# Patient Record
Sex: Female | Born: 1960
Health system: Southern US, Community
[De-identification: ages and names within clinical notes are randomized; demographics above are authoritative.]

---

## 2019-04-14 DIAGNOSIS — K648 Other hemorrhoids: Secondary | ICD-10-CM | POA: Diagnosis not present

## 2019-08-13 ENCOUNTER — Other Ambulatory Visit (HOSPITAL_COMMUNITY)
Admission: RE | Admit: 2019-08-13 | Discharge: 2019-08-13 | Disposition: A | Discharge status: Home / Self Care | Payer: Commercial Managed Care - PPO | Source: Ambulatory Visit | Attending: Nurse Practitioner | Admitting: Nurse Practitioner

## 2019-08-13 ENCOUNTER — Other Ambulatory Visit: Payer: Self-pay | Admitting: Obstetrics and Gynecology

## 2019-08-13 DIAGNOSIS — Z124 Encounter for screening for malignant neoplasm of cervix: Secondary | ICD-10-CM | POA: Diagnosis not present

## 2019-08-14 ENCOUNTER — Other Ambulatory Visit: Payer: Self-pay | Admitting: Nurse Practitioner

## 2019-08-14 DIAGNOSIS — M8580 Other specified disorders of bone density and structure, unspecified site: Secondary | ICD-10-CM

## 2019-08-14 LAB — CYTOLOGY - PAP
Diagnosis: NEGATIVE
HPV: NOT DETECTED

## 2020-02-25 ENCOUNTER — Other Ambulatory Visit: Payer: Self-pay | Admitting: Nurse Practitioner

## 2020-02-25 DIAGNOSIS — Z1231 Encounter for screening mammogram for malignant neoplasm of breast: Secondary | ICD-10-CM

## 2020-03-15 ENCOUNTER — Ambulatory Visit
Admission: RE | Admit: 2020-03-15 | Discharge: 2020-03-15 | Disposition: A | Discharge status: Home / Self Care | Payer: Commercial Managed Care - PPO | Source: Ambulatory Visit | Attending: Nurse Practitioner | Admitting: Nurse Practitioner

## 2020-03-15 ENCOUNTER — Other Ambulatory Visit: Payer: Self-pay

## 2020-03-15 DIAGNOSIS — Z1231 Encounter for screening mammogram for malignant neoplasm of breast: Secondary | ICD-10-CM

## 2020-05-02 ENCOUNTER — Other Ambulatory Visit: Payer: Self-pay | Admitting: Nurse Practitioner

## 2020-05-02 DIAGNOSIS — E2839 Other primary ovarian failure: Secondary | ICD-10-CM

## 2020-05-23 ENCOUNTER — Ambulatory Visit
Admission: RE | Admit: 2020-05-23 | Discharge: 2020-05-23 | Disposition: A | Discharge status: Home / Self Care | Payer: Commercial Managed Care - PPO | Source: Ambulatory Visit | Attending: Nurse Practitioner | Admitting: Nurse Practitioner

## 2020-05-23 ENCOUNTER — Other Ambulatory Visit: Payer: Self-pay

## 2020-05-23 DIAGNOSIS — E2839 Other primary ovarian failure: Secondary | ICD-10-CM

## 2020-07-07 ENCOUNTER — Ambulatory Visit: Payer: Commercial Managed Care - PPO | Admitting: Neurology

## 2020-08-16 ENCOUNTER — Telehealth: Payer: Self-pay | Admitting: Neurology

## 2020-08-16 NOTE — Telephone Encounter (Signed)
Called pt to reschedule 09/08 appointment due to MD having flight issues and being out of office. Pt became upset that she was being rescheduled and asked for a call back from the nurse to discuss. Assured pt that we would get her back in as soon as possible and that her appointment was being cancelled due to issues beyond our control. Pt is still requested call back at [phone].

## 2020-08-16 NOTE — Telephone Encounter (Signed)
Dr Pearlean Brownie now has an opening tomorrow AM at 11:30. Can you please call her to see if she can come then?

## 2020-08-17 ENCOUNTER — Ambulatory Visit (INDEPENDENT_AMBULATORY_CARE_PROVIDER_SITE_OTHER): Payer: Commercial Managed Care - PPO | Admitting: Neurology

## 2020-08-17 ENCOUNTER — Ambulatory Visit: Payer: Commercial Managed Care - PPO | Admitting: Neurology

## 2020-08-17 ENCOUNTER — Encounter: Payer: Self-pay | Admitting: Neurology

## 2020-08-17 ENCOUNTER — Telehealth: Payer: Self-pay | Admitting: Neurology

## 2020-08-17 VITALS — BP 124/83 | HR 77 | Ht 62.0 in | Wt 150.0 lb

## 2020-08-17 DIAGNOSIS — R202 Paresthesia of skin: Secondary | ICD-10-CM | POA: Diagnosis not present

## 2020-08-17 NOTE — Progress Notes (Signed)
[facility] [address] [place]. [place] [address] [phone]       OFFICE CONSULT NOTE  Ms. Bethany Burns Date of Birth:  01/26/1961 Medical Record Number:  [id number]   Referring MD: Jarrett Soho, PA-C  Reason for Referral: Numbness HPI: Bethany Burns is a pleasant 59 year old Hispanic lady with past medical history of osteopenia, hemorrhoids and menopause.  She states she has had 5-year history of progressive paresthesias.  This began mostly in lower extremities and was intermittent involving her thighs on occasional days when she woke up.  These were transient and not bothersome.  However the last year or so paresthesias seem to have progressed.  They are now also involve the left hand mostly and sometimes left cheek as well.  Occasionally the right hand.  These tend to stay longer.  They are still not bothering and she is not taking any medicines for them.  However they are occurring with increased frequency and seem to be staying longer.  She denies any weakness, trouble walking, headache, vertigo, double vision.  She denies excessive fatigue or tiredness and trouble controlling her bowel or bladder.  She has no prior history of neurological problems.  She has not had any specific lab work or imaging studies done for these.  She does have borderline diabetes which is diet controlled.  Recent lab work on 06/09/2020 showed cholesterol to be fine.  She denies any significant neck pain radicular pain, back pain or pain shooting down the legs.  She does admit to increasing stress in the last year or 2 secondary to Covid pandemic denies any major changes in her life her family situation and job situation.  ROS:   14 system review of systems is positive for numbness, tingling, imbalance and all other systems negative  PMH: No past medical history on file.  Social History:  Social History   Socioeconomic History  . Marital status: Unknown    Spouse name: Not on file    . Number of children: Not on file  . Years of education: Not on file  . Highest education level: Not on file  Occupational History  . Not on file  Tobacco Use  . Smoking status: Never Smoker  . Smokeless tobacco: Never Used  Substance and Sexual Activity  . Alcohol use: Not on file  . Drug use: Not on file  . Sexual activity: Not on file  Other Topics Concern  . Not on file  Social History Narrative  . Not on file   Social Determinants of Health   Financial Resource Strain:   . Difficulty of Paying Living Expenses: Not on file  Food Insecurity:   . Worried About Programme researcher, broadcasting/film/video in the Last Year: Not on file  . Ran Out of Food in the Last Year: Not on file  Transportation Needs:   . Lack of Transportation (Medical): Not on file  . Lack of Transportation (Non-Medical): Not on file  Physical Activity:   . Days of Exercise per Week: Not on file  . Minutes of Exercise per Session: Not on file  Stress:   . Feeling of Stress : Not on file  Social Connections:   . Frequency of Communication with Friends and Family: Not on file  . Frequency of Social Gatherings with Friends and Family: Not on file  . Attends Religious Services: Not on file  . Active Member of Clubs or Organizations: Not on file  . Attends Banker Meetings: Not on  file  . Marital Status: Not on file  Intimate Partner Violence:   . Fear of Current or Ex-Partner: Not on file  . Emotionally Abused: Not on file  . Physically Abused: Not on file  . Sexually Abused: Not on file    Medications:   Current Outpatient Medications on File Prior to Visit  Medication Sig Dispense Refill  . Cyanocobalamin (VITAMIN B 12) 100 MCG LOZG Take by mouth.    . Multiple Vitamin (MULTIVITAMIN WITH MINERALS) TABS tablet Take 1 tablet by mouth daily.     No current facility-administered medications on file prior to visit.    Allergies:   Allergies  Allergen Reactions  . Latex Itching    Physical  Exam General: well developed, well nourished middle-aged Hispanic lady, seated, in no evident distress Head: head normocephalic and atraumatic.   Neck: supple with no carotid or supraclavicular bruits Cardiovascular: regular rate and rhythm, no murmurs Musculoskeletal: no deformity Skin:  no rash/petichiae Vascular:  Normal pulses all extremities  Neurologic Exam Mental Status: Awake and fully alert. Oriented to place and time. Recent and remote memory intact. Attention span, concentration and fund of knowledge appropriate. Mood and affect appropriate.  Cranial Nerves: Fundoscopic exam reveals sharp disc margins. Pupils equal, briskly reactive to light. Extraocular movements full without nystagmus. Visual fields full to confrontation. Hearing intact. Facial sensation intact. Face, tongue, palate moves normally and symmetrically.  Motor: Normal bulk and tone. Normal strength in all tested extremity muscles. Sensory.: intact to touch , pinprick , position and vibratory sensation except slight diminished vibration over toes bilaterally only..  Coordination: Rapid alternating movements normal in all extremities. Finger-to-nose and heel-to-shin performed accurately bilaterally. Gait and Station: Arises from chair without difficulty. Stance is normal. Gait demonstrates normal stride length and balance . Able to heel, toe and tandem walk with slight difficulty.  Reflexes: 1+ and symmetric. Toes downgoing.       ASSESSMENT: 58 year old lady with longstanding history of progressive fleeting paresthesias involving all 4 extremities and face of unclear etiology.  Possibilities include demyelinating, inflammatory or vasculitic conditions.  Underlying anxiety may also be contributing     PLAN: I had a long discussion with the patient regarding her complaints of paresthesias which appear to be progressing and out of unclear etiology.  I recommend further evaluation by checking lab work for inflammatory  and demyelinating conditions as well as checking MRI scan of the brain and cervical spine.  The paresthesias do not seem to be disabling at the present time and she is feels she does not need specific medications for the same.  I also encouraged her to increase participation in regular activities for stress relaxation like exercise, swimming, meditation and yoga.  She will call me if her symptoms get worse.  Greater than 50% time during this 45-minute consultation was it was spent on counseling and coordination of care about her paresthesias and discussion about evaluation and treatment and answering questions.  She will return for follow-up in 2 months or call earlier if necessary. Delia Heady, MD Note: This document was prepared with digital dictation and possible smart phrase technology. Any transcriptional errors that result from this process are unintentional.

## 2020-08-17 NOTE — Telephone Encounter (Signed)
[facility] pending faxed notes  

## 2020-08-17 NOTE — Patient Instructions (Signed)
I had a long discussion with the patient regarding her complaints of paresthesias which appear to be progressing and out of unclear etiology.  I recommend further evaluation by checking lab work for inflammatory and demyelinating conditions as well as checking MRI scan of the brain and cervical spine.  The paresthesias do not seem to be disabling at the present time and she is feels she does not need specific medications for the same.  I also encouraged her to increase participation in regular activities for stress laxation like exercise, swimming, meditation and yoga.  She will call me if her symptoms get worse.  She will return for follow-up in 2 months or call earlier if necessary.  Paresthesia Paresthesia is a burning or prickling feeling. This feeling can happen in any part of the body. It often happens in the hands, arms, legs, or feet. Usually, it is not painful. In most cases, the feeling goes away in a short time and is not a sign of a serious problem. If you have paresthesia that lasts a long time, you may need to be seen by your doctor. Follow these instructions at home: Alcohol use   Do not drink alcohol if: ? Your doctor tells you not to drink. ? You are pregnant, may be pregnant, or are planning to become pregnant.  If you drink alcohol: ? Limit how much you use to:  0-1 drink a day for women.  0-2 drinks a day for men. ? Be aware of how much alcohol is in your drink. In the [place] one drink equals one 12 oz bottle of beer (355 mL), one 5 oz glass of wine (148 mL), or one 1 oz glass of hard liquor (44 mL). Nutrition   Eat a healthy diet. This includes: ? Eating foods that have a lot of fiber in them, such as fresh fruits and vegetables, whole grains, and beans. ? Limiting foods that have a lot of fat and processed sugars in them, such as fried or sweet foods. General instructions  Take over-the-counter and prescription medicines only as told by your doctor.  Do not use any  products that have nicotine or tobacco in them, such as cigarettes and e-cigarettes. If you need help quitting, ask your doctor.  If you have diabetes, work with your doctor to make sure your blood sugar stays in a healthy range.  If your feet feel numb: ? Check for redness, warmth, and swelling every day. ? Wear padded socks and comfortable shoes. These help protect your feet.  Keep all follow-up visits as told by your doctor. This is important. Contact a doctor if:  You have paresthesia that gets worse or does not go away.  Your burning or prickling feeling gets worse when you walk.  You have pain or cramps.  You feel dizzy.  You have a rash. Get help right away if you:  Feel weak.  Have trouble walking or moving.  Have problems speaking, understanding, or seeing.  Feel confused.  Cannot control when you pee (urinate) or poop (have a bowel movement).  Lose feeling (have numbness) after an injury.  Have new weakness in an arm or leg.  Pass out (faint). Summary  Paresthesia is a burning or prickling feeling. It often happens in the hands, arms, legs, or feet.  In most cases, the feeling goes away in a short time and is not a sign of a serious problem.  If you have paresthesia that lasts a long time, you may need  to be seen by your doctor. This information is not intended to replace advice given to you by your health care provider. Make sure you discuss any questions you have with your health care provider. Document Revised: 12/22/2018 Document Reviewed: 12/05/2017 Elsevier Patient Education  2020 [employer].

## 2020-08-18 ENCOUNTER — Ambulatory Visit: Payer: Commercial Managed Care - PPO | Admitting: Neurology

## 2020-08-18 NOTE — Telephone Encounter (Signed)
i spoke to the patient due to the cost she is going to hold off. I did offer the payment plan.  [facility] Auth: [id number] (exp. 08/17/20 to 09/16/20)

## 2020-08-18 NOTE — Progress Notes (Signed)
Kindly inform the patient that not all lab work is back yet but most of the lab work which is back seems fine.  Screening test for diabetes suggest she is at borderline risk.  Thyroid function tests is normal.  Vitamin D is low normal but acceptable.  ESR and lab work for rheumatoid arthritis and sarcoidosis is normal

## 2020-08-19 LAB — NEUROPATHY PANEL
A/G Ratio: 1.2 (ref 0.7–1.7)
Albumin ELP: 3.9 g/dL (ref 2.9–4.4)
Alpha 1: 0.2 g/dL (ref 0.0–0.4)
Alpha 2: 0.6 g/dL (ref 0.4–1.0)
Angio Convert Enzyme: 50 U/L (ref 14–82)
Anti Nuclear Antibody (ANA): NEGATIVE
Beta: 1.3 g/dL (ref 0.7–1.3)
Gamma Globulin: 1.2 g/dL (ref 0.4–1.8)
Globulin, Total: 3.3 g/dL (ref 2.2–3.9)
Rheumatoid fact SerPl-aCnc: <10 IU/mL (ref 0.0–13.9)
Sed Rate: 3 mm/hr (ref 0–40)
TSH: 1.89 u[IU]/mL (ref 0.450–4.500)
Total Protein: 7.2 g/dL (ref 6.0–8.5)
Vit D, 25-Hydroxy: 30.9 ng/mL (ref 30.0–100.0)
Vitamin B-12: 1152 pg/mL (ref 232–1245)

## 2020-08-19 LAB — THYROID PANEL WITH TSH
Free Thyroxine Index: 2.1 (ref 1.2–4.9)
T3 Uptake Ratio: 28 % (ref 24–39)
T4, Total: 7.5 ug/dL (ref 4.5–12.0)

## 2020-08-19 LAB — HEMOGLOBIN A1C
Est. average glucose Bld gHb Est-mCnc: 128 mg/dL
Hgb A1c MFr Bld: 6.1 % — ABNORMAL HIGH (ref 4.8–5.6)

## 2020-08-24 ENCOUNTER — Telehealth: Payer: Self-pay

## 2020-08-24 NOTE — Telephone Encounter (Signed)
-----  Message from Garvin Fila, MD sent at 08/18/2020  4:33 PM EDT ----- Mitchell Heir inform the patient that not all lab work is back yet but most of the lab work which is back seems fine.  Screening test for diabetes suggest she is at borderline risk.  Thyroid function tests is normal.  Vitamin D is low normal but acceptable.  ESR and lab work for rheumatoid arthritis and sarcoidosis is normal

## 2020-08-24 NOTE — Telephone Encounter (Signed)
Pt verified by name and DOB, results given per provider, pt voiced understanding all question answered. 

## 2020-08-24 NOTE — Telephone Encounter (Signed)
-----  Message from Garvin Fila, MD sent at 08/18/2020  4:33 PM EDT ----- Bethany Burns inform the patient that not all lab work is back yet but most of the lab work which is back seems fine.  Screening test for diabetes suggest she is at borderline risk.  Thyroid function tests is normal.  Vitamin D is low normal but acceptable.  ESR and lab work for rheumatoid arthritis and sarcoidosis is normal

## 2020-10-27 ENCOUNTER — Ambulatory Visit: Payer: Commercial Managed Care - PPO | Admitting: Neurology

## 2020-10-27 ENCOUNTER — Ambulatory Visit: Payer: Commercial Managed Care - PPO | Admitting: Adult Health

## 2021-08-15 IMAGING — MG DIGITAL SCREENING BILAT W/ TOMO W/ CAD
8 series · 8 of 24 positions shown · non-contrast
Comparison: Previous exam(s).

CLINICAL DATA: Screening.

EXAM:
DIGITAL SCREENING BILATERAL MAMMOGRAM WITH TOMO AND CAD

[L MLO synth-2D]
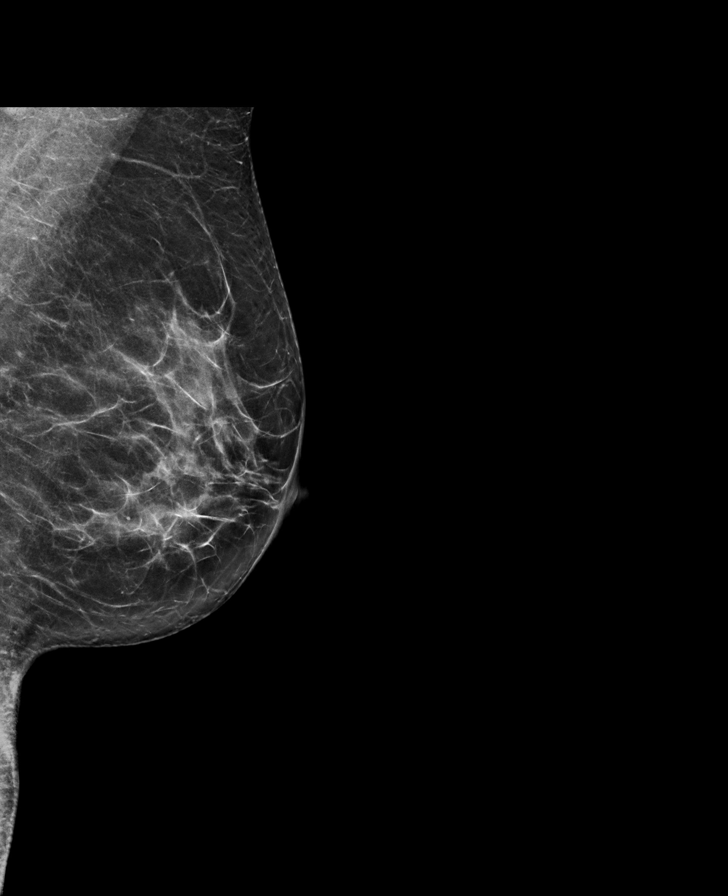

[R CC synth-2D]
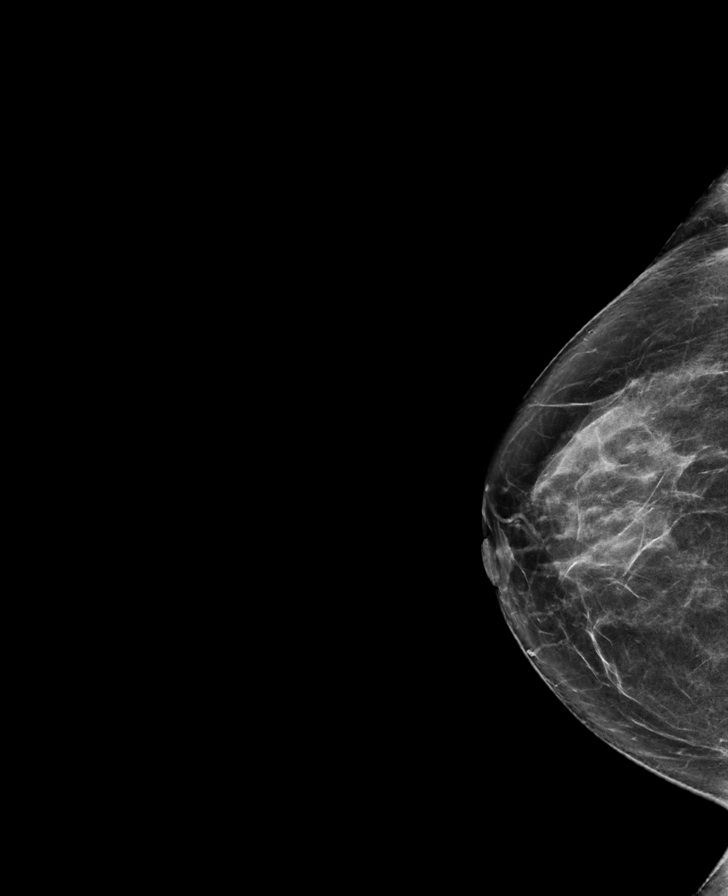

[L CC synth-2D]
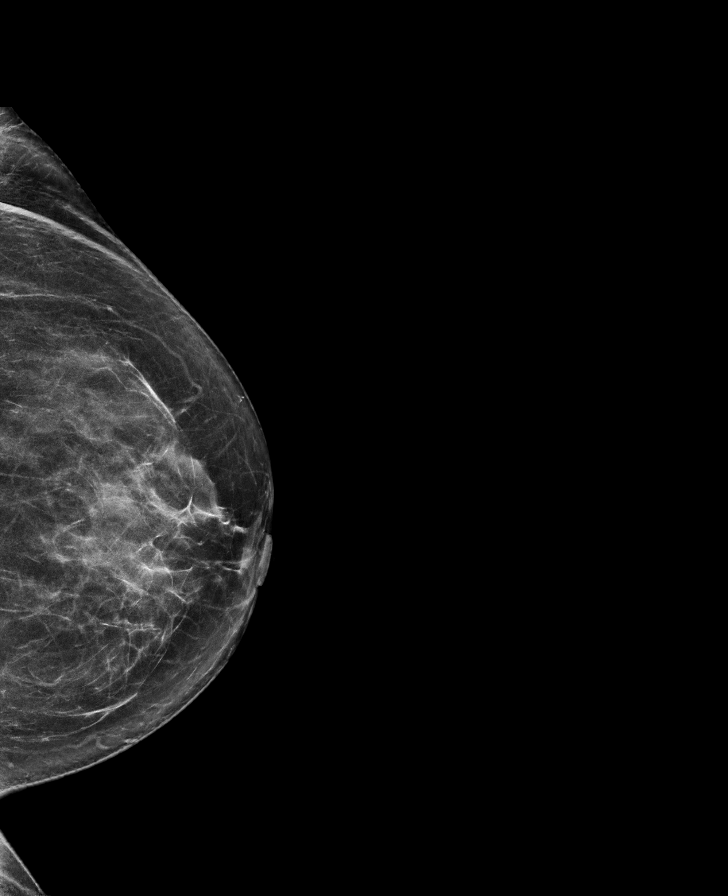

[R MLO synth-2D]
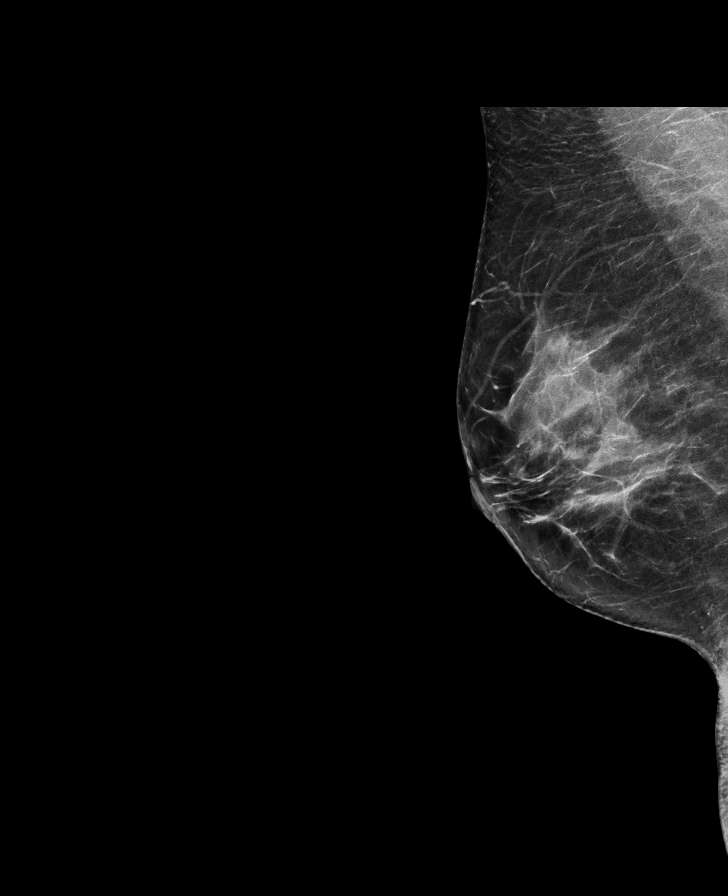

[L MLO tomo · tomo slice 35/69.0]
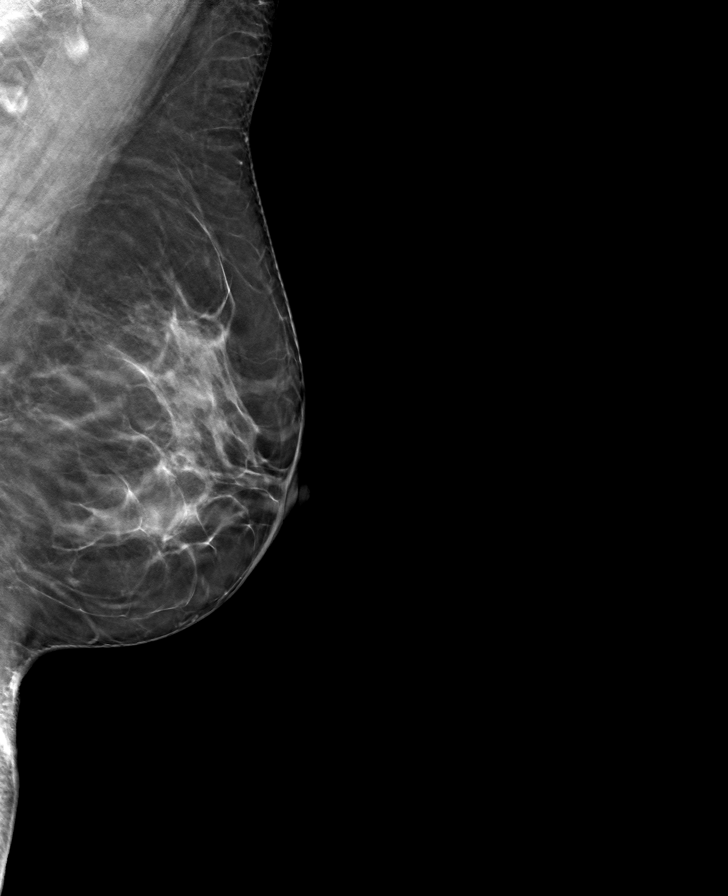

[R MLO tomo · tomo slice 35/69.0]
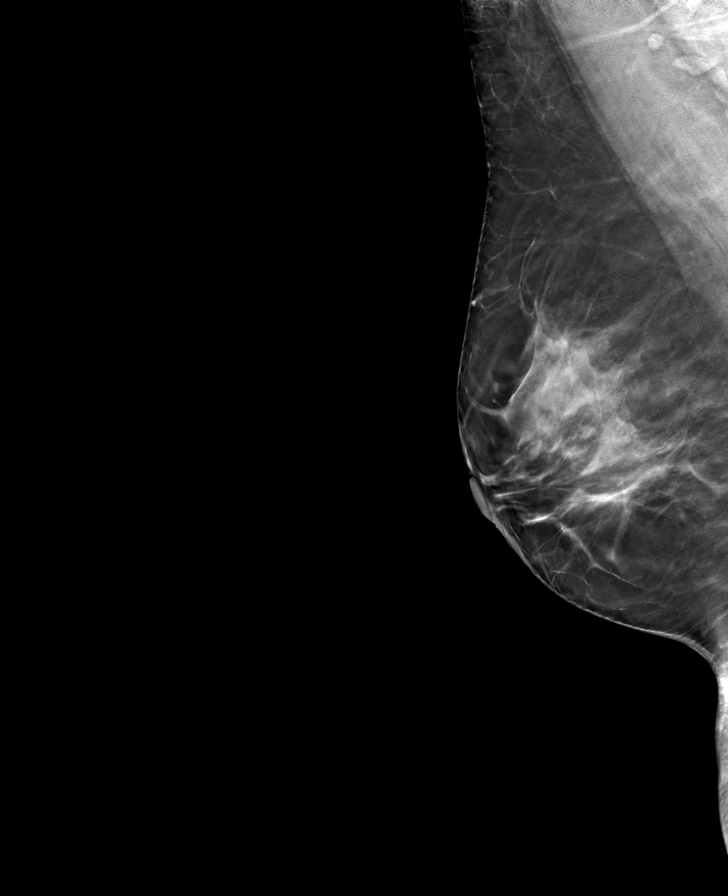

[R CC tomo · tomo slice 35/70.0]
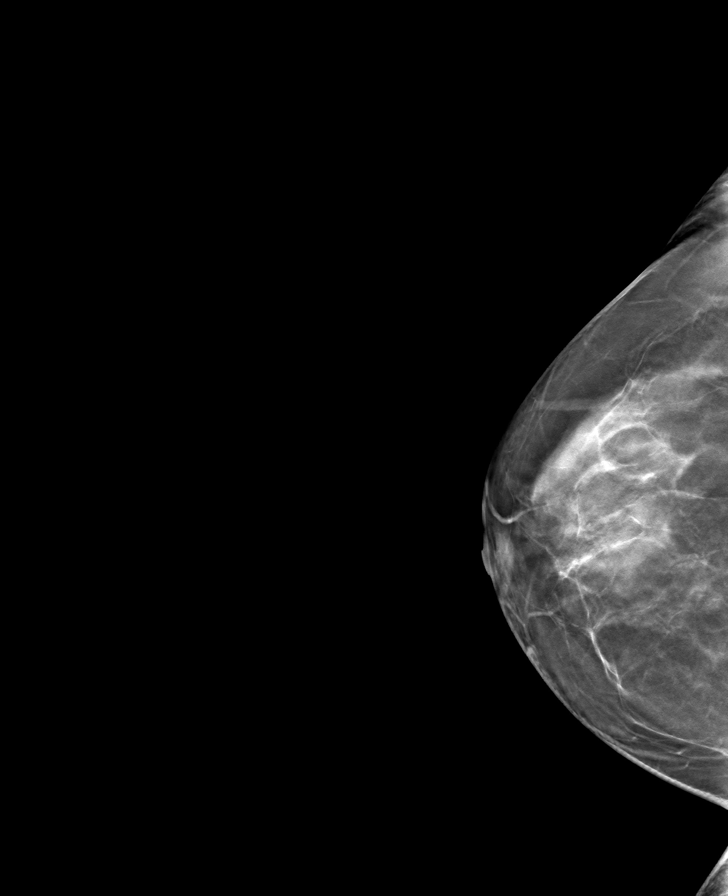

[L CC tomo · tomo slice 35/68.0]
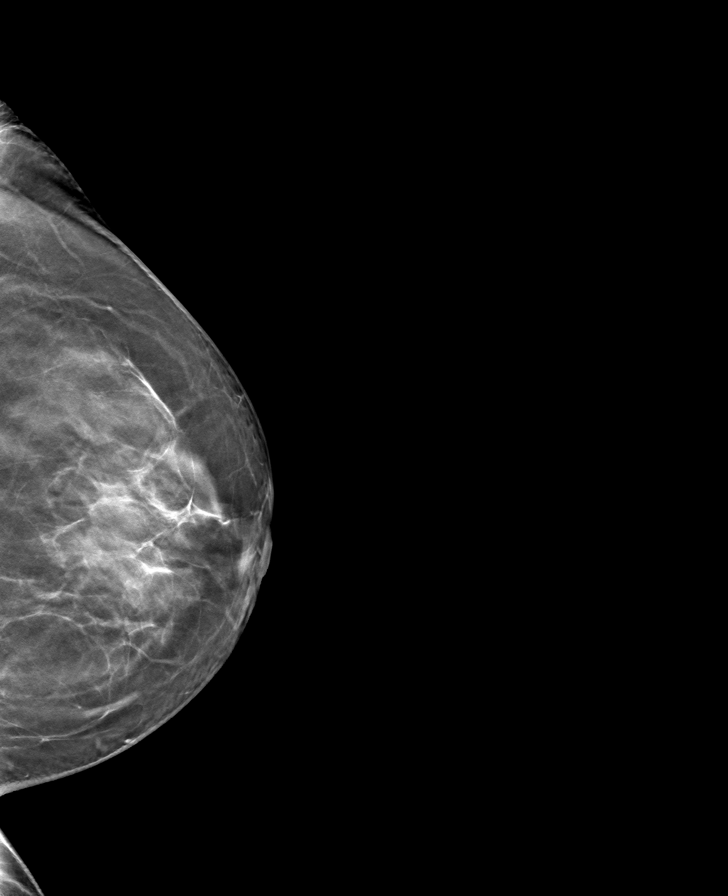

[8 of 24 positions shown; findings below may reference images not displayed]

ACR Breast Density Category c: The breast tissue is heterogeneously
dense, which may obscure small masses.
FINDINGS: There are no findings suspicious for malignancy. Images were
processed with CAD.
IMPRESSION: No mammographic evidence of malignancy. A result letter of this
screening mammogram will be mailed directly to the patient.

RECOMMENDATION:
Screening mammogram in one year. (Code:[id number]-[id number])

BI-RADS CATEGORY  1: Negative.
